# Patient Record
Sex: Female | Born: 1975 | Race: White | Hispanic: No | Marital: Married | Smoking: Current every day smoker
Health system: Southern US, Community
[De-identification: ages and names within clinical notes are randomized; demographics above are authoritative.]

---

## 1998-12-13 ENCOUNTER — Other Ambulatory Visit: Admission: RE | Admit: 1998-12-13 | Discharge: 1998-12-13 | Payer: Self-pay | Admitting: Obstetrics & Gynecology

## 1999-03-11 ENCOUNTER — Inpatient Hospital Stay (HOSPITAL_COMMUNITY): Admission: AD | Admit: 1999-03-11 | Discharge: 1999-03-11 | Payer: Self-pay | Admitting: Obstetrics and Gynecology

## 1999-07-22 ENCOUNTER — Inpatient Hospital Stay (HOSPITAL_COMMUNITY): Admission: AD | Admit: 1999-07-22 | Discharge: 1999-07-23 | Payer: Self-pay | Admitting: Obstetrics & Gynecology

## 1999-08-28 ENCOUNTER — Other Ambulatory Visit: Admission: RE | Admit: 1999-08-28 | Discharge: 1999-08-28 | Payer: Self-pay | Admitting: Obstetrics & Gynecology

## 2008-02-01 ENCOUNTER — Ambulatory Visit: Payer: Self-pay | Admitting: Family Medicine

## 2008-02-01 DIAGNOSIS — Z9189 Other specified personal risk factors, not elsewhere classified: Secondary | ICD-10-CM | POA: Insufficient documentation

## 2008-02-02 ENCOUNTER — Encounter: Payer: Self-pay | Admitting: Family Medicine

## 2008-02-02 LAB — CONVERTED CEMR LAB: Varicella IgG: 3.19 — ABNORMAL HIGH

## 2008-02-04 ENCOUNTER — Encounter: Payer: Self-pay | Admitting: Family Medicine

## 2008-07-05 ENCOUNTER — Ambulatory Visit: Payer: Self-pay | Admitting: Family Medicine

## 2008-07-05 DIAGNOSIS — J019 Acute sinusitis, unspecified: Secondary | ICD-10-CM

## 2008-07-27 ENCOUNTER — Ambulatory Visit: Payer: Self-pay | Admitting: Family Medicine

## 2008-07-27 DIAGNOSIS — L02838 Carbuncle of other sites: Secondary | ICD-10-CM

## 2008-07-27 DIAGNOSIS — R112 Nausea with vomiting, unspecified: Secondary | ICD-10-CM

## 2008-07-27 DIAGNOSIS — L02828 Furuncle of other sites: Secondary | ICD-10-CM

## 2008-07-27 DIAGNOSIS — N912 Amenorrhea, unspecified: Secondary | ICD-10-CM | POA: Insufficient documentation

## 2008-07-27 LAB — CONVERTED CEMR LAB
Beta hcg, urine, semiquantitative: NEGATIVE
Ketones, urine, test strip: NEGATIVE
Nitrite: NEGATIVE
Protein, U semiquant: NEGATIVE
Urobilinogen, UA: 0.2
WBC Urine, dipstick: NEGATIVE
pH: 6.5

## 2008-07-28 LAB — CONVERTED CEMR LAB
Albumin: 4.2 g/dL (ref 3.5–5.2)
Alkaline Phosphatase: 65 units/L (ref 39–117)
BUN: 6 mg/dL (ref 6–23)
Basophils Absolute: 0 10*3/uL (ref 0.0–0.1)
Basophils Relative: 1 % (ref 0–1)
Glucose, Bld: 105 mg/dL — ABNORMAL HIGH (ref 70–99)
Hemoglobin: 14.2 g/dL (ref 12.0–15.0)
Lymphocytes Relative: 17 % (ref 12–46)
MCHC: 33 g/dL (ref 30.0–36.0)
Monocytes Absolute: 0.3 10*3/uL (ref 0.1–1.0)
Neutro Abs: 6.2 10*3/uL (ref 1.7–7.7)
Neutrophils Relative %: 78 % — ABNORMAL HIGH (ref 43–77)
Platelets: 219 10*3/uL (ref 150–400)
Potassium: 4.2 meq/L (ref 3.5–5.3)
RDW: 13.1 % (ref 11.5–15.5)
Sodium: 140 meq/L (ref 135–145)
TSH: 0.993 microintl units/mL (ref 0.350–4.50)
hCG, Beta Chain, Quant, S: <2 milliintl units/mL

## 2008-08-08 ENCOUNTER — Ambulatory Visit: Payer: Self-pay | Admitting: Family Medicine

## 2008-08-08 DIAGNOSIS — IMO0002 Reserved for concepts with insufficient information to code with codable children: Secondary | ICD-10-CM

## 2008-08-17 ENCOUNTER — Ambulatory Visit: Payer: Self-pay | Admitting: Obstetrics & Gynecology

## 2008-08-18 ENCOUNTER — Encounter: Payer: Self-pay | Admitting: Obstetrics & Gynecology

## 2008-08-18 LAB — CONVERTED CEMR LAB: FSH: 4.1 milliintl units/mL

## 2010-03-08 ENCOUNTER — Ambulatory Visit: Payer: Self-pay | Admitting: Emergency Medicine

## 2010-03-08 DIAGNOSIS — L03319 Cellulitis of trunk, unspecified: Secondary | ICD-10-CM

## 2010-03-08 DIAGNOSIS — L02219 Cutaneous abscess of trunk, unspecified: Secondary | ICD-10-CM

## 2010-09-25 NOTE — Assessment & Plan Note (Signed)
Summary: LESION LOWER ABDOMEN AREA/TJ   Vital Signs:  Patient Profile:   35 Years Old Female CC:      lesion/boil to left groin X 3 days Height:     64.5 inches Weight:      138 pounds O2 Sat:      97 % O2 treatment:    Room Air Temp:     98.3 degrees F oral Pulse rate:   118 / minute Resp:     14 per minute BP sitting:   129 / 83  (right arm) Cuff size:   regular  Pt. in pain?   yes    Location:   left groin    Type:       heaviness  Vitals Entered By: Lajean Saver RN (March 08, 2010 4:21 PM)                   Updated Prior Medication List: No Medications Current Allergies (reviewed today): No known allergies History of Present Illness Chief Complaint: lesion/boil to left groin X 3 days History of Present Illness: Lesion/boil in left groin for 3 days.  Her husband has culture-confirmed MRSA being treated successfully with Bactrim.  Her's is constantly sore & painful, no draiange yet, firm, swollen, and red.  No fever.  Hasn't tried any OTC meds yet.  REVIEW OF SYSTEMS Constitutional Symptoms      Denies fever, chills, night sweats, weight loss, weight gain, and fatigue.  Eyes       Denies change in vision, eye pain, eye discharge, glasses, contact lenses, and eye surgery. Ear/Nose/Throat/Mouth       Denies hearing loss/aids, change in hearing, ear pain, ear discharge, dizziness, frequent runny nose, frequent nose bleeds, sinus problems, sore throat, hoarseness, and tooth pain or bleeding.  Respiratory       Denies dry cough, productive cough, wheezing, shortness of breath, asthma, bronchitis, and emphysema/COPD.  Cardiovascular       Denies murmurs, chest pain, and tires easily with exhertion.    Gastrointestinal       Denies stomach pain, nausea/vomiting, diarrhea, constipation, blood in bowel movements, and indigestion. Genitourniary       Denies painful urination, kidney stones, and loss of urinary control. Neurological       Denies paralysis, seizures, and  fainting/blackouts. Musculoskeletal       Denies muscle pain, joint pain, joint stiffness, decreased range of motion, redness, swelling, muscle weakness, and gout.  Skin       Complains of unusual moles/lumps or sores.      Denies bruising and hair/skin or nail changes.      Comments: left groin Psych       Denies mood changes, temper/anger issues, anxiety/stress, speech problems, depression, and sleep problems. Other Comments: Husband with MRSa to left leg. Patient noticed lesion 3 days ago to left groin   Past History:  Past Medical History: Reviewed history from 02/01/2008 and no changes required. G3P2  gyn: [facility]  Past Surgical History: Reviewed history from 02/01/2008 and no changes required. none  Family History: Reviewed history from 02/01/2008 and no changes required. 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 Has 2 kids  Married to Ethel. Denies ETOH. Smokes 1 ppd x 5 yrs. Walks daily. Physical Exam General appearance: well developed, well nourished, no acute distress Chest/Lungs: no rales, wheezes, or rhonchi bilateral, breath sounds equal without effort Heart: regular rate and  rhythm, no murmur  GU: Left groin area with firm swollen erythematous area, no fluctuance, no folliculitis, tender to palpation, no drainage or bleeding Skin: see above, otherwise normal Assessment New Problems: CELLULITIS, GROIN, LEFT (ICD-682.2)   Plan New Medications/Changes: HIBICLENS 4 % LIQD (CHLORHEXIDINE GLUCONATE) the whole family (ages >80 years old) to shower twice a week head to toe for a few weeks  #1 bottle x 1, 03/08/2010, Hoyt Koch MD BACTRIM DS 800-160 MG TABS (SULFAMETHOXAZOLE-TRIMETHOPRIM) 1 tab by mouth two times a day for 10 days  #20 x 0, 03/08/2010, Hoyt Koch MD  New Orders: New Patient Level III [phone]  The patient and/or caregiver has been counseled thoroughly with regard  to medications prescribed including dosage, schedule, interactions, rationale for use, and possible side effects and they verbalize understanding.  Diagnoses and expected course of recovery discussed and will return if not improved as expected or if the condition worsens. Patient and/or caregiver verbalized understanding.  Prescriptions: HIBICLENS 4 % LIQD (CHLORHEXIDINE GLUCONATE) the whole family (ages >49 years old) to shower twice a week head to toe for a few weeks  #1 bottle x 1   Entered and Authorized by:   Hoyt Koch MD   Signed by:   Hoyt Koch MD on 03/08/2010   Method used:   Print then Give to Patient   RxID:   [id number] BACTRIM DS 800-160 MG TABS (SULFAMETHOXAZOLE-TRIMETHOPRIM) 1 tab by mouth two times a day for 10 days  #20 x 0   Entered and Authorized by:   Hoyt Koch MD   Signed by:   Hoyt Koch MD on 03/08/2010   Method used:   Print then Give to Patient   RxID:   [id number]   Patient Instructions: 1)  Heating pad (do not share between family members) 2)  Ibuprofen for pain 3)  If worsening redness, pain, swelling, fever, return to clinic or Follow-up with your primary care physician  Orders Added: 1)  New Patient Level III [29528]

## 2011-01-11 NOTE — H&P (Signed)
[facility]  PatientEASTYN SKALLA                        MRN: [id number] Adm. Date:  [id number] Attending:  Lenoard Aden CC:         [facility]                         History and Physical  CHIEF COMPLAINT:              Labor.  HISTORY OF PRESENT ILLNESS:   [age]-year-old white female, G1, P0, EDD July 19, 1999, who presents in active labor.  PAST MEDICAL HISTORY:         Noncontributory.  FAMILY HISTORY:               Noncontributory.  MEDICATIONS:                  Prenatal vitamins.  OBSTETRICAL HISTORY:          Noncontributory.  ALLERGIES:                    Patient has no known drug allergies.  PREGNANCY COURSE:             Uncomplicated.  PHYSICAL EXAMINATION:  GENERAL:                      Well-developed, well-nourished white female in no  apparent distress.  HEENT:                        Normal.  LUNGS:                        Clear.  HEART:                        Regular rate and rhythm.  ABDOMEN:                      Soft, gravid and nontender.  PELVIC:                       Cervix completely dilated and +2 station.  EXTREMITIES:                  No cords.  NEUROLOGIC:                   Nonfocal.  IMPRESSION:                   Term intrauterine pregnancy, in active labor.  PLAN:                         Anticipate vaginal delivery. DD:  07/22/99 TD:  07/22/99 Job: [id number]

## 2013-03-22 ENCOUNTER — Inpatient Hospital Stay: Payer: Self-pay

## 2013-03-22 LAB — CBC WITH DIFFERENTIAL/PLATELET
Basophil #: 0.1 10*3/uL (ref 0.0–0.1)
Basophil %: 1 %
Eosinophil #: 0.2 10*3/uL (ref 0.0–0.7)
Eosinophil %: 1.5 %
MCHC: 34.5 g/dL (ref 32.0–36.0)
MCV: 89 fL (ref 80–100)
Platelet: 236 10*3/uL (ref 150–440)
RBC: 3.92 10*6/uL (ref 3.80–5.20)
RDW: 13.6 % (ref 11.5–14.5)
WBC: 15.2 10*3/uL — ABNORMAL HIGH (ref 3.6–11.0)

## 2015-01-03 NOTE — H&P (Signed)
L&D Evaluation:  History Expanded:  HPI 39 yo G4P2012 at at 846w3d gestational age by LMP.  Pregnancy complicated by advanced maternal age.  [place] 37 [place].  There are no prenatal records available, though we've attempted to obtain them. She presents with grossly ruptured membranes since 5am today.  She states that she has not had a large amount of fluid, only a gush this morning.  She noted some "bloody show" with her gush.  She notes occasional "tightening." She notes positive fetal movement.   Gravida 4   Term 2   PreTerm 0   Abortion 1   Living 2   Blood Type (Maternal) pending   Group B Strep Results Maternal (Result >5wks must be treated as unknown) negative  03/10/13   Maternal HIV Unknown   Maternal Syphilis Ab Unknown   Maternal Varicella Unknown   Rubella Results (Maternal) unknown   Maternal [facility]-Dap Unknown   [facility]   Patient's Medical History No Chronic Illness   Patient's Surgical History none   Medications Pre Natal Vitamins   Allergies NKDA   Social History Smokes when not pregnancy. Not currently.   Family History Non-Contributory   ROS:  ROS All systems were reviewed.  HEENT, CNS, GI, GU, Respiratory, CV, Renal and Musculoskeletal systems were found to be normal.   Exam:  Vital Signs BP 116/72, P 86   Urine Protein not completed   General no apparent distress, appears older than stated age   Mental Status clear   Chest clear   Heart normal sinus rhythm   Abdomen gravid, non-tender   Estimated Fetal Weight Average for gestational age, appropriate for dates, about 7 pounds   Fetal Position cephalic   Back no CVAT   Edema trace   Pelvic no external lesions, 3/50/-2   Mebranes Ruptured   Description clear, blood-tinge, Vernix present   FHT normal rate with no decels   FHT Description 120/mod var/+accels/no decels   Ucx irregular, 2 q 10 min   Impression:  Impression  SROM, labor   Plan:  Comments -admit for labor - usual labs, will attempt to [place] to see if they have records since practice has not sent. - GBS neg   Electronic Signatures: Conard NovakJackson, Stephen D (MD)  (Signed 28-Jul-14 09:17)  Authored: L&D Evaluation   Last Updated: 28-Jul-14 09:17 by Conard NovakJackson, Stephen D (MD)

## 2016-01-22 ENCOUNTER — Encounter: Payer: Self-pay | Admitting: Emergency Medicine

## 2016-01-22 DIAGNOSIS — F17210 Nicotine dependence, cigarettes, uncomplicated: Secondary | ICD-10-CM | POA: Diagnosis not present

## 2016-01-22 DIAGNOSIS — R2241 Localized swelling, mass and lump, right lower limb: Secondary | ICD-10-CM | POA: Diagnosis present

## 2016-01-22 DIAGNOSIS — R600 Localized edema: Secondary | ICD-10-CM | POA: Insufficient documentation

## 2016-01-22 NOTE — ED Notes (Addendum)
Patient ambulatory to triage with steady gait, without difficulty or distress noted; pt reports swelling to legs/feet bilat with pain; pt denies accomp symptoms; st hx of same with pregnancy; spoke with Dr Zenda AlpersWebster and verbal orders obtained

## 2016-01-23 ENCOUNTER — Emergency Department
Admission: EM | Admit: 2016-01-23 | Discharge: 2016-01-23 | Disposition: A | Discharge status: Home / Self Care | Payer: Medicaid Other | Attending: Emergency Medicine | Admitting: Emergency Medicine

## 2016-01-23 ENCOUNTER — Emergency Department: Payer: Medicaid Other

## 2016-01-23 DIAGNOSIS — R609 Edema, unspecified: Secondary | ICD-10-CM

## 2016-01-23 LAB — CBC
HCT: 40.6 % (ref 35.0–47.0)
Hemoglobin: 13.9 g/dL (ref 12.0–16.0)
MCH: 32 pg (ref 26.0–34.0)
MCHC: 34.2 g/dL (ref 32.0–36.0)
MCV: 93.7 fL (ref 80.0–100.0)
PLATELETS: 178 10*3/uL (ref 150–440)
RBC: 4.34 MIL/uL (ref 3.80–5.20)
RDW: 14.2 % (ref 11.5–14.5)
WBC: 6.8 10*3/uL (ref 3.6–11.0)

## 2016-01-23 LAB — BASIC METABOLIC PANEL
ANION GAP: 11 (ref 5–15)
BUN: 7 mg/dL (ref 6–20)
CALCIUM: 8.5 mg/dL — AB (ref 8.9–10.3)
CO2: 21 mmol/L — ABNORMAL LOW (ref 22–32)
Chloride: 110 mmol/L (ref 101–111)
Creatinine, Ser: 0.69 mg/dL (ref 0.44–1.00)
GLUCOSE: 94 mg/dL (ref 65–99)
Potassium: 3.2 mmol/L — ABNORMAL LOW (ref 3.5–5.1)
SODIUM: 142 mmol/L (ref 135–145)

## 2016-01-23 LAB — BRAIN NATRIURETIC PEPTIDE: B Natriuretic Peptide: 101 pg/mL — ABNORMAL HIGH (ref 0.0–100.0)

## 2016-01-23 LAB — HCG, QUANTITATIVE, PREGNANCY: hCG, Beta Chain, Quant, S: 1 m[IU]/mL (ref <5)

## 2016-01-23 MED ORDER — FUROSEMIDE 20 MG PO TABS: 20.0000 mg | ORAL_TABLET | Freq: Every day | ORAL | Status: AC

## 2016-01-23 MED ORDER — LEVETIRACETAM 750 MG PO TABS: 750.0000 mg | ORAL_TABLET | Freq: Two times a day (BID) | ORAL | Status: DC

## 2016-01-23 MED ORDER — LISINOPRIL 5 MG PO TABS: 5.0000 mg | ORAL_TABLET | Freq: Once | ORAL | Status: DC

## 2016-01-23 MED ORDER — LAMOTRIGINE 25 MG PO TABS: 50.0000 mg | ORAL_TABLET | Freq: Every day | ORAL | Status: DC
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
## 2016-01-23 NOTE — ED Notes (Signed)
Pt states that earlier today she noticed both ankles and feet became swollen. Pt states she thought she was pregnant, it was negative. Pt states her feet have gone numb in last few days randomly without sitting on them but felt like they do when you sit on them. Pt in no distress at this time. Pedal pulses felt bilaterally. Feet warm and dry. No pitting edema noted at this time.

## 2016-01-23 NOTE — ED Notes (Signed)
Pt c/o right and left swelling to feet and ankles x1 day. Pt denies medical hx. Pt has strong dorsalis pedis pulses and warm to the touch. No redness noted or edema.

## 2016-01-23 NOTE — ED Notes (Signed)
Patient transported to Ultrasound 

## 2016-01-23 NOTE — Discharge Instructions (Signed)
1. Take fluid pill (Lasix 20mg ) daily x 3 days. 2. Elevate legs whenever possible. 3. Return to the ER for worsening symptoms, persistent vomiting, difficulty breathing or other concerns.  Peripheral Edema You have swelling in your legs (peripheral edema). This swelling is due to excess accumulation of salt and water in your body. Edema may be a sign of heart, kidney or liver disease, or a side effect of a medication. It may also be due to problems in the leg veins. Elevating your legs and using special support stockings may be very helpful, if the cause of the swelling is due to poor venous circulation. Avoid long periods of standing, whatever the cause. Treatment of edema depends on identifying the cause. Chips, pretzels, pickles and other salty foods should be avoided. Restricting salt in your diet is almost always needed. Water pills (diuretics) are often used to remove the excess salt and water from your body via urine. These medicines prevent the kidney from reabsorbing sodium. This increases urine flow. Diuretic treatment may also result in lowering of potassium levels in your body. Potassium supplements may be needed if you have to use diuretics daily. Daily weights can help you keep track of your progress in clearing your edema. You should call your caregiver for follow up care as recommended. SEEK IMMEDIATE MEDICAL CARE IF:   You have increased swelling, pain, redness, or heat in your legs.  You develop shortness of breath, especially when lying down.  You develop chest or abdominal pain, weakness, or fainting.  You have a fever.   This information is not intended to replace advice given to you by your health care provider. Make sure you discuss any questions you have with your health care provider.   Document Released: 09/19/2004 Document Revised: 11/04/2011 Document Reviewed: 02/22/2015 Elsevier Interactive Patient Education [employer]2016 [employer] Inc.

## 2017-03-05 IMAGING — US US EXTREM LOW VENOUS BILAT
1 series · 13 of 24 positions shown · non-contrast
Comparison: None.

CLINICAL DATA: Bilateral leg pain.



[Series 1: us extrem low venous bilat · 0.06mm/px · 13 of 42 slices shown]
[im 1/42]
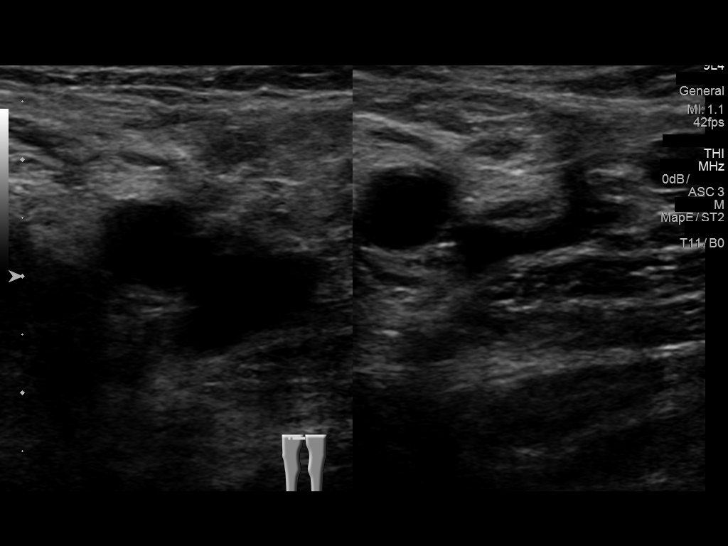
[im 4/42]
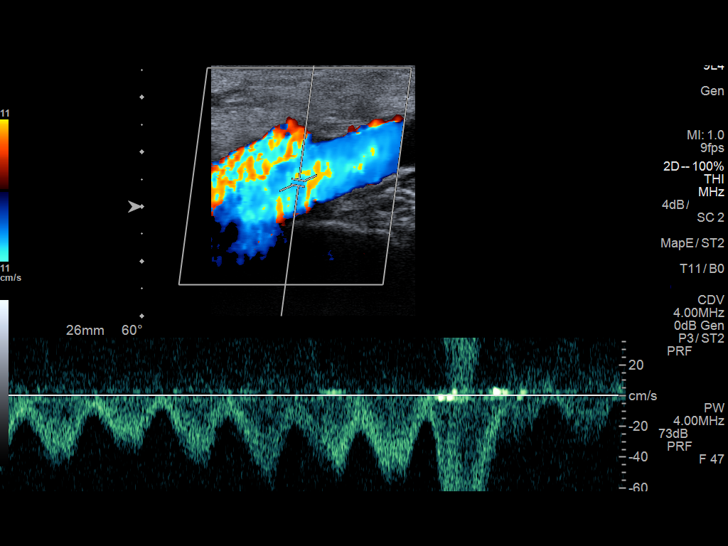
[im 8/42]
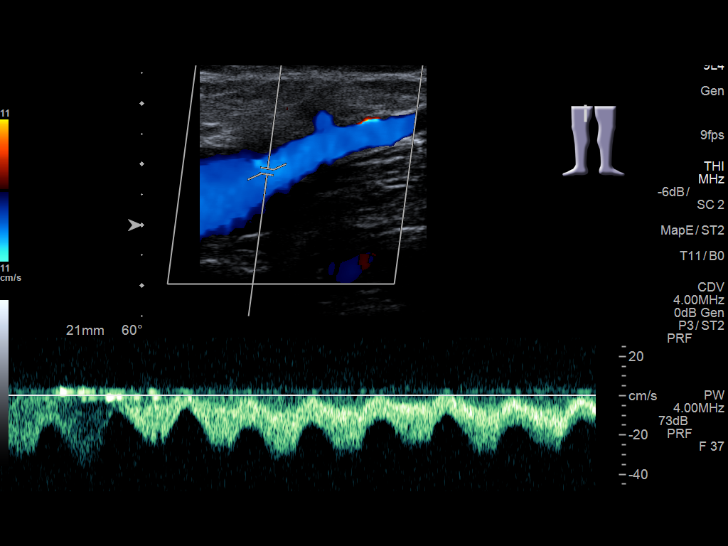
[im 11/42]
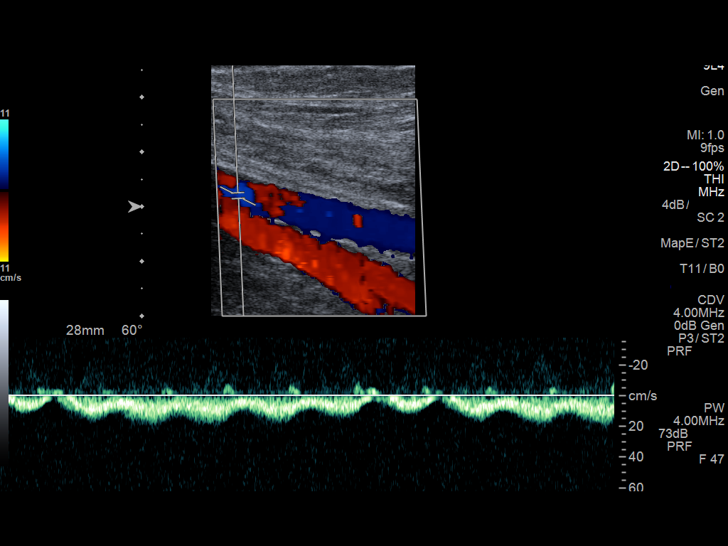
[im 15/42]
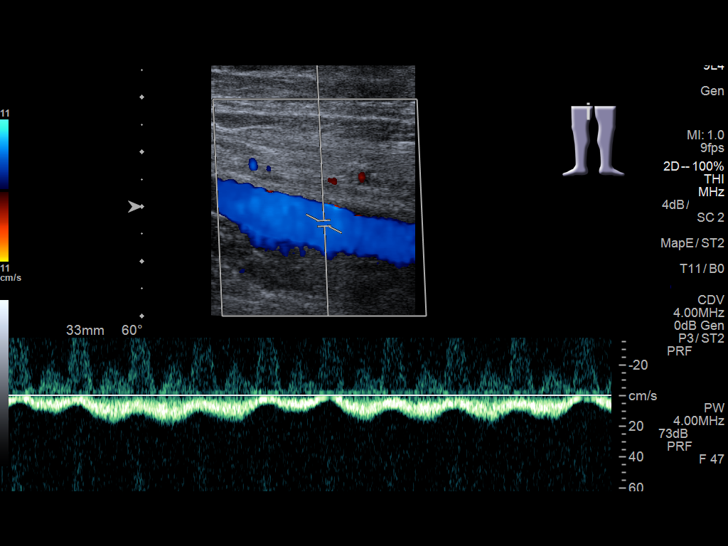
[im 18/42]
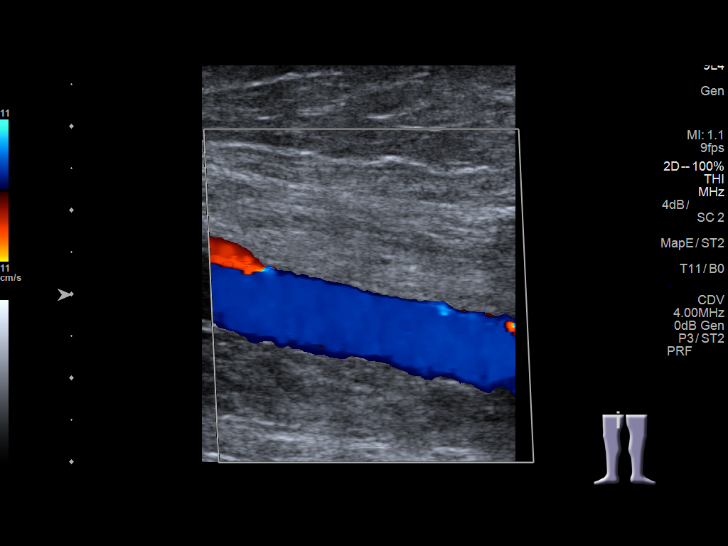
[im 22/42]
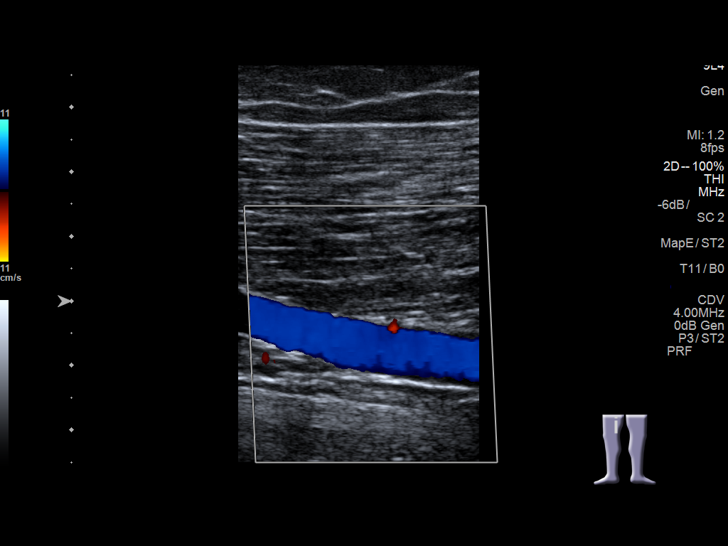
[im 24/42]
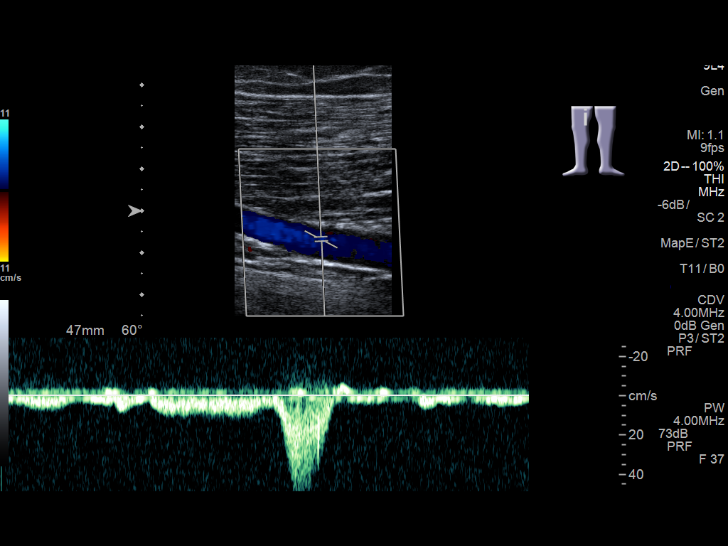
[im 27/42]
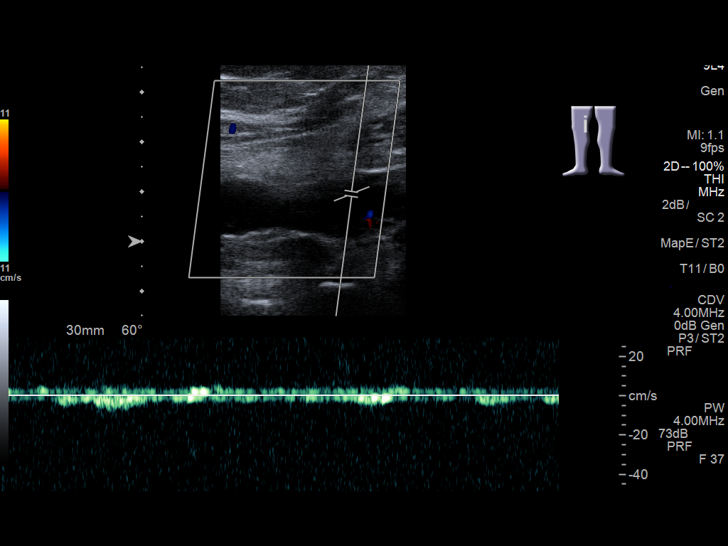
[im 31/42]
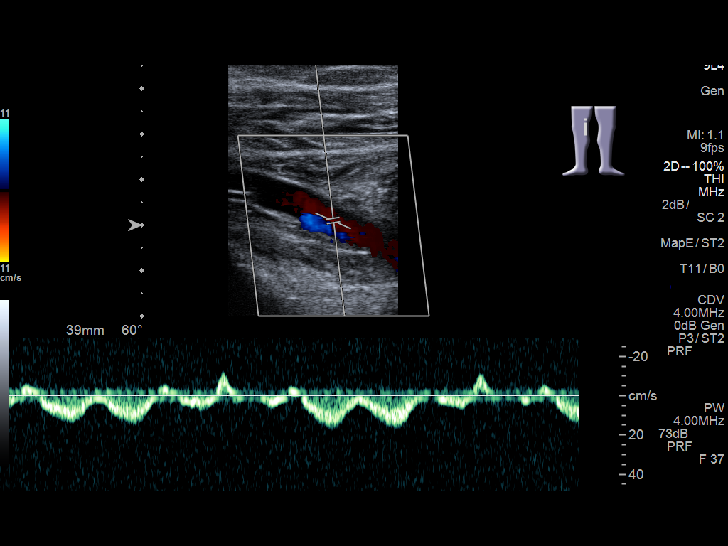
[im 34/42]
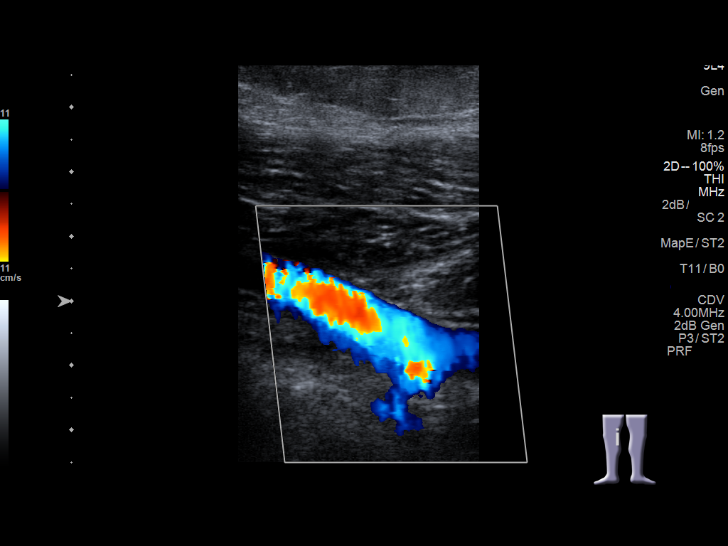
[im 38/42]
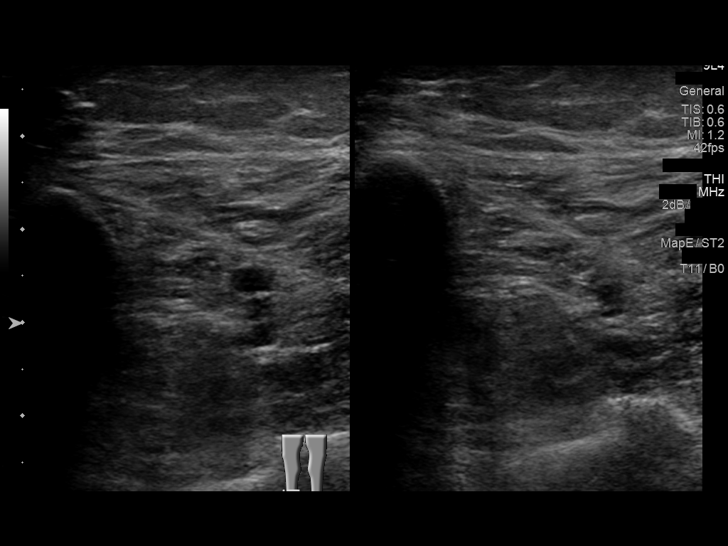
[im 42/42]
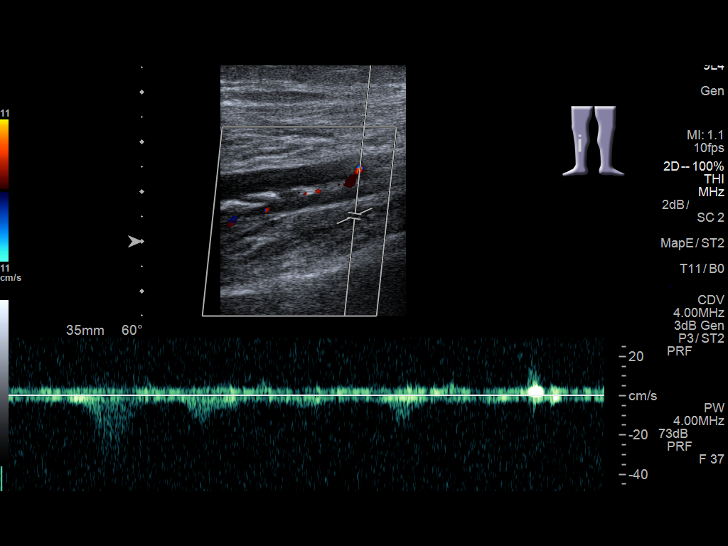

[13 of 24 positions shown; findings below may reference images not displayed]

FINDINGS: RIGHT LOWER EXTREMITY

Common Femoral Vein: No evidence of thrombus. Normal
compressibility, respiratory phasicity and response to augmentation.

Saphenofemoral Junction: No evidence of thrombus. Normal
compressibility and flow on color Doppler imaging.

Profunda Femoral Vein: No evidence of thrombus. Normal
compressibility and flow on color Doppler imaging.

Femoral Vein: No evidence of thrombus. Normal compressibility,
respiratory phasicity and response to augmentation.

Popliteal Vein: No evidence of thrombus. Normal compressibility,
respiratory phasicity and response to augmentation.

Calf Veins: No evidence of thrombus. Normal compressibility and flow
on color Doppler imaging.

Superficial Great Saphenous Vein: No evidence of thrombus. Normal
compressibility and flow on color Doppler imaging.

Venous Reflux:  None.

Other Findings:  None.

LEFT LOWER EXTREMITY

Common Femoral Vein: No evidence of thrombus. Normal
compressibility, respiratory phasicity and response to augmentation.

Saphenofemoral Junction: No evidence of thrombus. Normal
compressibility and flow on color Doppler imaging.

Profunda Femoral Vein: No evidence of thrombus. Normal
compressibility and flow on color Doppler imaging.

Femoral Vein: No evidence of thrombus. Normal compressibility,
respiratory phasicity and response to augmentation.

Popliteal Vein: No evidence of thrombus. Normal compressibility,
respiratory phasicity and response to augmentation.

Calf Veins: No evidence of thrombus. Normal compressibility and flow
on color Doppler imaging.

Superficial Great Saphenous Vein: No evidence of thrombus. Normal
compressibility and flow on color Doppler imaging.

Venous Reflux:  None.

Other Findings:  None.
IMPRESSION: No evidence of bilateral lower extremity deep venous thrombosis.

## 2022-10-12 ENCOUNTER — Ambulatory Visit: Payer: Self-pay
